# Patient Record
Sex: Male | Born: 1975 | Race: Black or African American | Hispanic: No | Marital: Single
Health system: Southern US, Community
[De-identification: ages and names within clinical notes are randomized; demographics above are authoritative.]

## PROBLEM LIST (undated history)

## (undated) HISTORY — PX: BACK SURGERY: SHX140

## (undated) HISTORY — PX: FINGER SURGERY: SHX640

---

## 2016-03-01 ENCOUNTER — Emergency Department (HOSPITAL_COMMUNITY)
Admission: EM | Admit: 2016-03-01 | Discharge: 2016-03-01 | Disposition: A | Discharge status: Home / Self Care | Payer: Self-pay | Attending: Emergency Medicine | Admitting: Emergency Medicine

## 2016-03-01 ENCOUNTER — Encounter (HOSPITAL_COMMUNITY): Payer: Self-pay | Admitting: Emergency Medicine

## 2016-03-01 ENCOUNTER — Emergency Department (HOSPITAL_COMMUNITY): Payer: Self-pay

## 2016-03-01 DIAGNOSIS — X509XXA Other and unspecified overexertion or strenuous movements or postures, initial encounter: Secondary | ICD-10-CM | POA: Insufficient documentation

## 2016-03-01 DIAGNOSIS — Y9389 Activity, other specified: Secondary | ICD-10-CM | POA: Insufficient documentation

## 2016-03-01 DIAGNOSIS — Y999 Unspecified external cause status: Secondary | ICD-10-CM | POA: Insufficient documentation

## 2016-03-01 DIAGNOSIS — Z791 Long term (current) use of non-steroidal anti-inflammatories (NSAID): Secondary | ICD-10-CM | POA: Insufficient documentation

## 2016-03-01 DIAGNOSIS — M25561 Pain in right knee: Secondary | ICD-10-CM | POA: Insufficient documentation

## 2016-03-01 DIAGNOSIS — Y9289 Other specified places as the place of occurrence of the external cause: Secondary | ICD-10-CM | POA: Insufficient documentation

## 2016-03-01 MED ORDER — NAPROXEN 500 MG PO TABS
500.0000 mg | ORAL_TABLET | Freq: Once | ORAL | Status: AC
  Administered 2016-03-01: 500 mg via ORAL
  Filled 2016-03-01: qty 1

## 2016-03-01 NOTE — ED Notes (Signed)
Was standing up from the dinner table last night when he felt a "pop" in his right knee. Took ibuprofen and went to sleep. No swelling. C/o medial right knee pain with straightening of the leg. No obvious injuries visible.

## 2016-03-01 NOTE — ED Provider Notes (Signed)
CSN: [id number]     Arrival date & time 03/01/16  0825 History   First MD Initiated Contact with Patient 03/01/16 [phone]     Chief Complaint  Patient presents with  . Knee Pain     (Consider location/radiation/quality/duration/timing/severity/associated sxs/prior Treatment) HPI   Marcus Gibson is a 40 y.o. male, patient with no pertinent past medical history, presenting to the ED with right knee pain beginning last night. Got up from the dining table last night and when he sat down, he felt a "pop" and then pain in the right knee. No pain when at rest. Pain is 6 out of 10, throbbing, nonradiating when the patient straightens his leg or applies weight. Patient has been using ibuprofen with relief. Patient denies falls, trauma, previous injury or surgery, neuro deficits, or any other complaints.   History reviewed. No pertinent past medical history. Past Surgical History  Procedure Laterality Date  . Back surgery    . Finger surgery     History reviewed. No pertinent family history. Social History  Substance Use Topics  . Smoking status: None  . Smokeless tobacco: None  . Alcohol Use: None    Review of Systems  Musculoskeletal: Positive for arthralgias (right knee). Negative for joint swelling.  Neurological: Negative for weakness and numbness.      Allergies  Review of patient's allergies indicates no known allergies.  Home Medications   Prior to Admission medications   Medication Sig Start Date End Date Taking? Authorizing Provider  ibuprofen (ADVIL,MOTRIN) 200 MG tablet Take 200 mg by mouth every 6 (six) hours as needed for headache or moderate pain.   Yes Historical Provider, MD   BP 127/89 mmHg  Pulse 78  Temp(Src) 98.3 F (36.8 C) (Oral)  Resp 16  SpO2 100% Physical Exam  Constitutional: He appears well-developed and well-nourished. No distress.  HENT:  Head: Normocephalic and atraumatic.  Eyes: Conjunctivae are normal.  Neck: Neck supple.  Cardiovascular:  Normal rate, regular rhythm and intact distal pulses.   Pulmonary/Chest: Effort normal.  Musculoskeletal:  Full range of motion in the lower extremities. Some tenderness to the medial right knee. No laxity, crepitus, swelling, or deformity. No increased warmth or erythema.  Neurological: He is alert. He has normal reflexes.  No sensory deficits. Strength 5 out of 5.  Skin: Skin is warm and dry. He is not diaphoretic.  Psychiatric: He has a normal mood and affect. His behavior is normal.  Nursing note and vitals reviewed.   ED Course  Procedures (including critical care time)  Imaging Review Dg Knee Complete 4 Views Right  03/01/2016  CLINICAL DATA:  Right non radiating medial knee pain x 2 days with no injury ; no hx previous injury to the knee EXAM: RIGHT KNEE - COMPLETE 4+ VIEW COMPARISON:  None. FINDINGS: No evidence of fracture, dislocation, or joint effusion. No evidence of arthropathy or other focal bone abnormality. Soft tissues are unremarkable. IMPRESSION: Negative. Electronically Signed   By: Corlis Leak  Hassell M.D.   On: 03/01/2016 10:32   I have personally reviewed and evaluated these images as part of my medical decision-making.   EKG Interpretation None      MDM   Final diagnoses:  Right knee pain    Marcus Gibson presents with right knee pain that began suddenly last night.  No abnormalities found on patient's x-ray. No indication for emergent orthopedic consult. No sign of DVT or septic joint. Knee sleeve and crutches provided. Patient advised on home care and  return precautions. Follow up with PCP or orthopedics should symptoms continue. Patient voiced understanding of these instructions and is comfortable with discharge.  Filed Vitals:   03/01/16 0833 03/01/16 1114  BP: 127/89 134/84  Pulse: 78 68  Temp: 98.3 F (36.8 C)   TempSrc: Oral   Resp: 16 16  SpO2: 100% 100%     Anselm Pancoast, PA-C 03/01/16 1610  Linwood Dibbles, MD 03/01/16 1627

## 2016-03-01 NOTE — Discharge Instructions (Signed)
You have been seen today for knee pain. Your imaging showed no abnormalities. Follow up with orthopedics should symptoms continue. Call the number provided to set up an appointment. Follow the attached literature for further home care instructions. Follow up with PCP as needed. Return to ED should symptoms worsen. May use ibuprofen or naproxen for pain.

## 2017-05-19 IMAGING — DX DG KNEE COMPLETE 4+V*R*
4 series · 4 of 4 positions shown · non-contrast
Comparison: None.

CLINICAL DATA: Right non radiating medial knee pain x 2 days with
no injury ; no hx previous injury to the knee

EXAM:
RIGHT KNEE - COMPLETE 4+ VIEW

[knee ap]
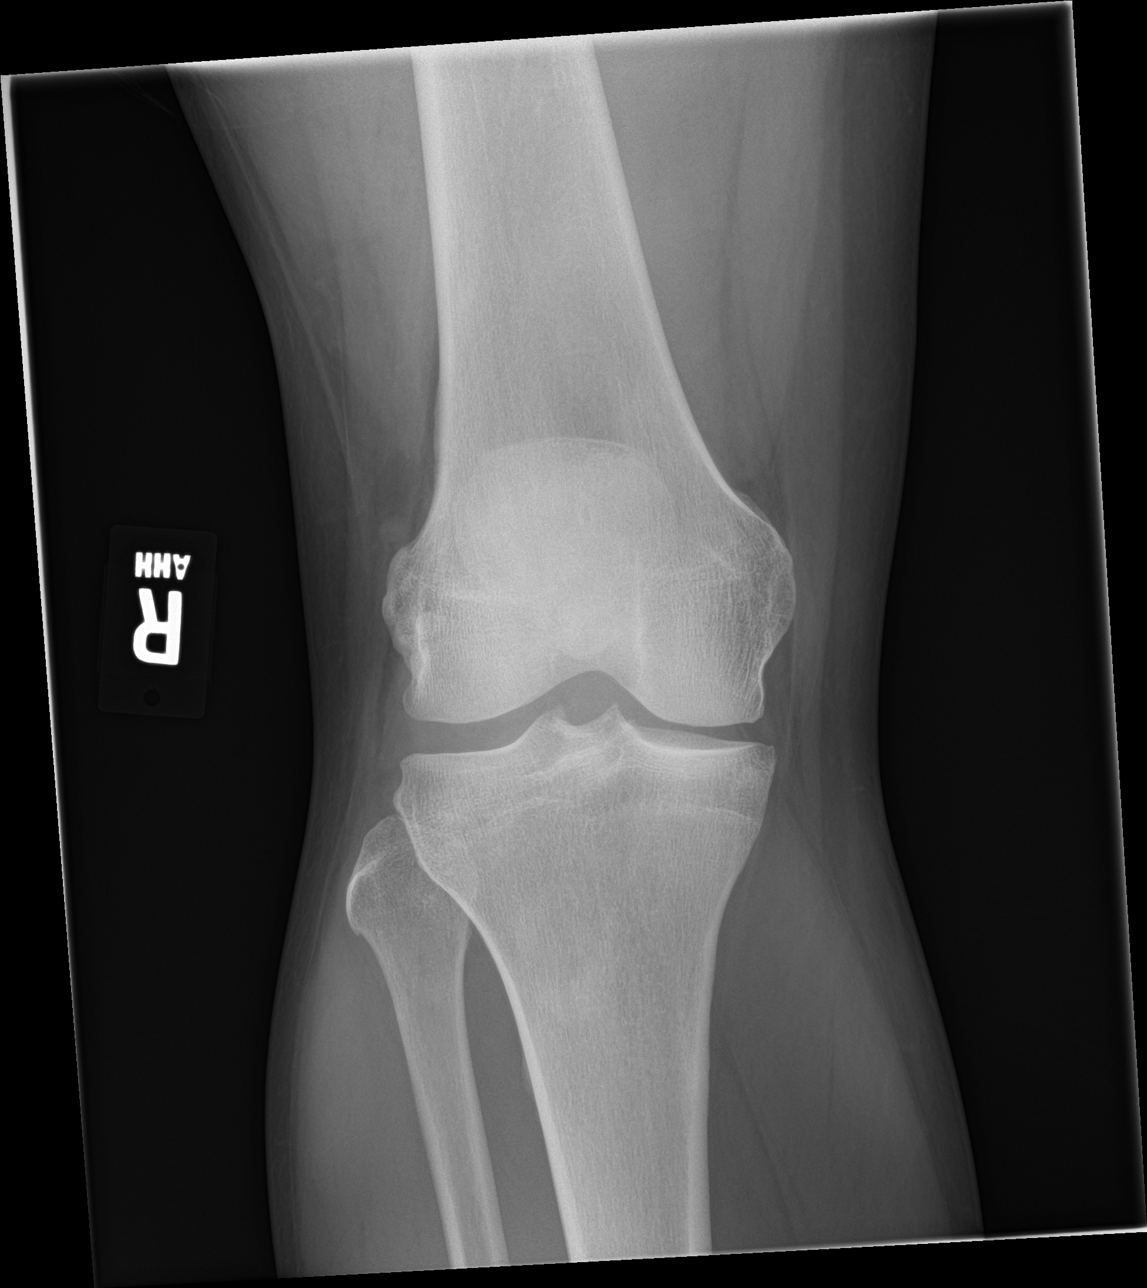

[knee lat]
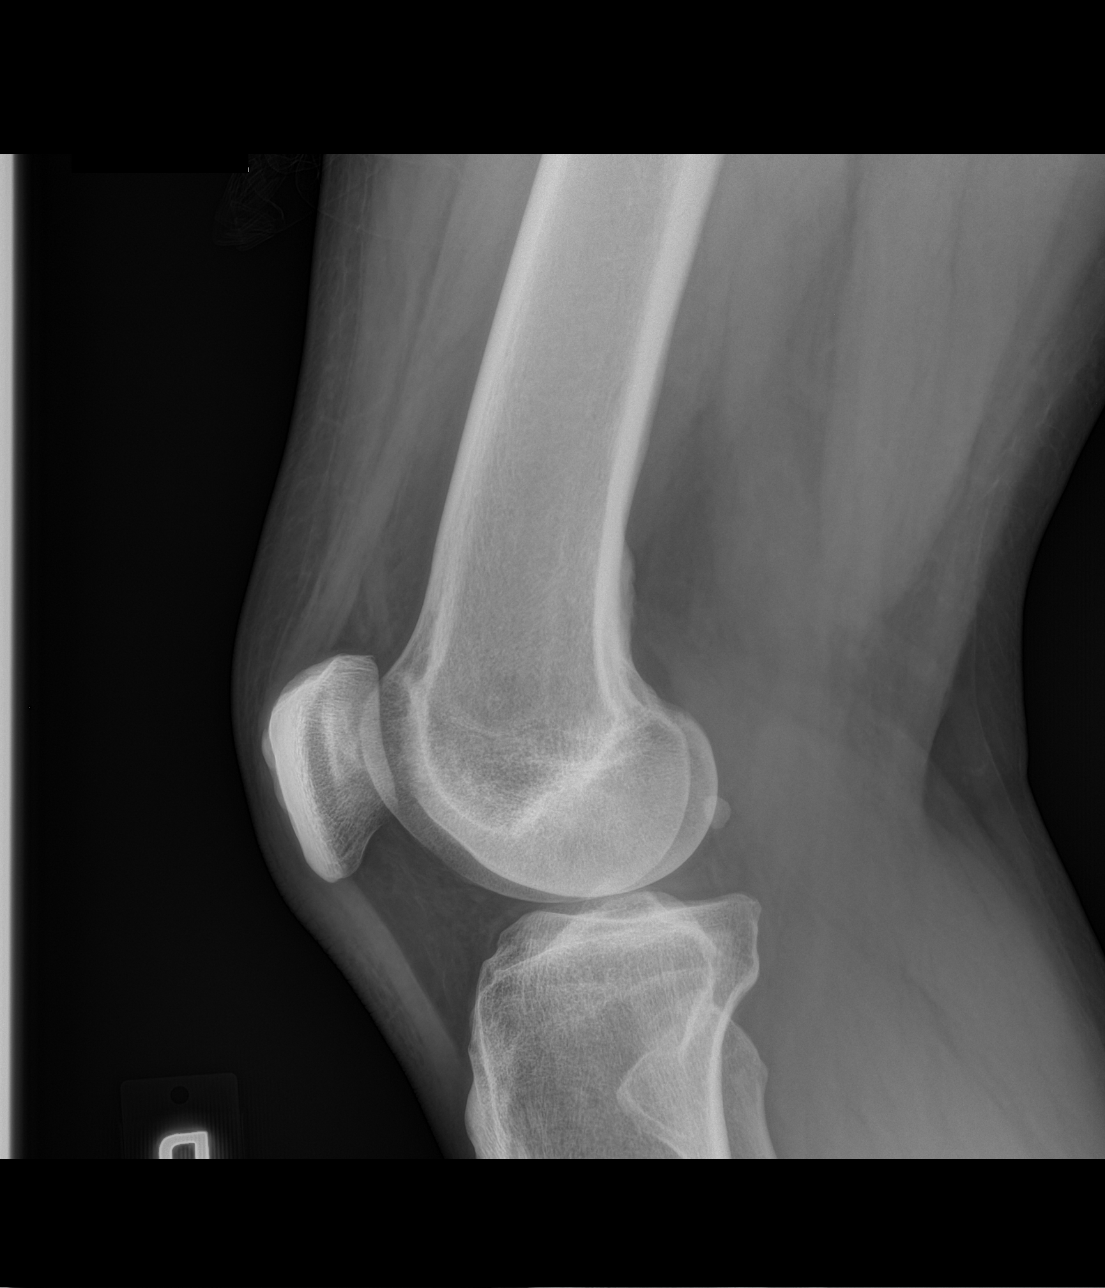

[knee obl (1 of 2)]
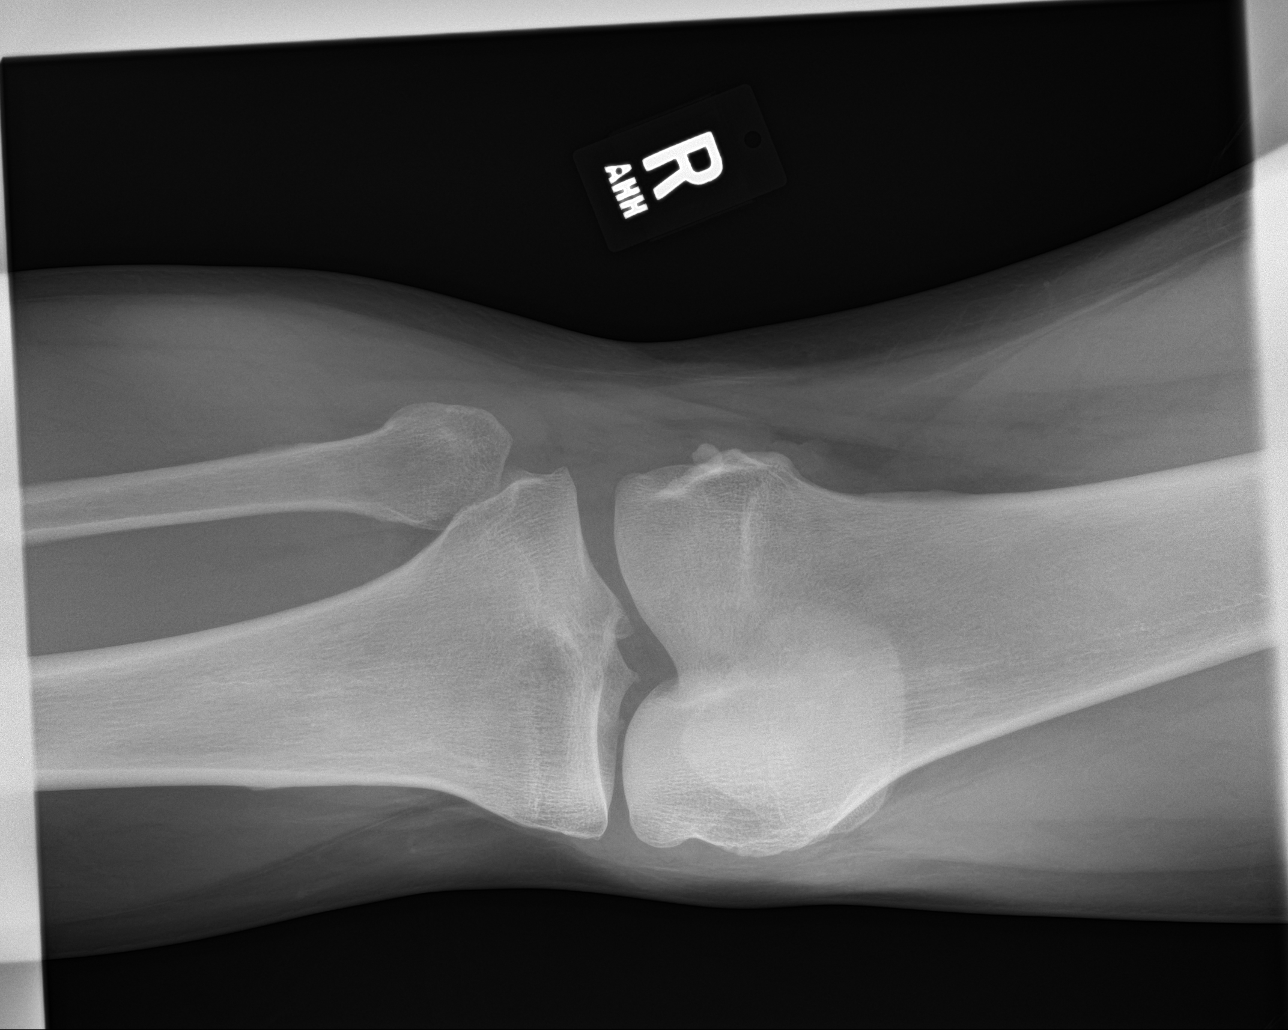

[knee obl (2 of 2)]
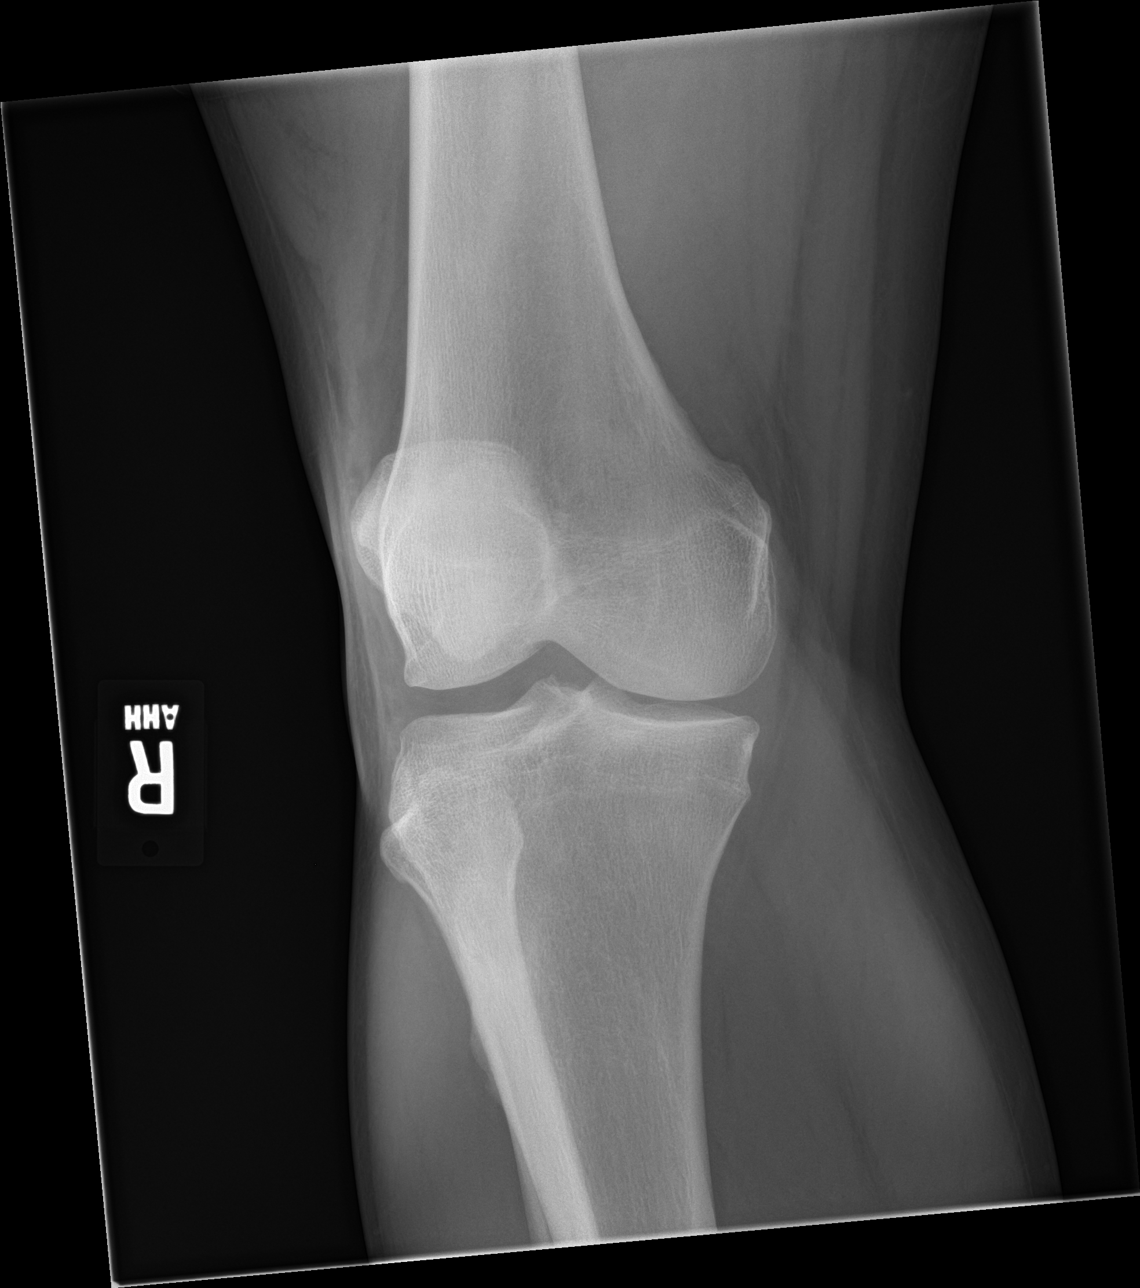

[4 of 4 positions shown; findings below may reference images not displayed]

FINDINGS: No evidence of fracture, dislocation, or joint effusion. No evidence
of arthropathy or other focal bone abnormality. Soft tissues are
unremarkable.
IMPRESSION: Negative.
